# Patient Record
Sex: Female | Born: 1970 | Race: White | Hispanic: No | Marital: Single
Health system: Southern US, Community
[De-identification: ages and names within clinical notes are randomized; demographics above are authoritative.]

---

## 1998-11-21 ENCOUNTER — Emergency Department (HOSPITAL_COMMUNITY): Admission: EM | Admit: 1998-11-21 | Discharge: 1998-11-21 | Payer: Self-pay | Admitting: Emergency Medicine

## 2006-04-10 ENCOUNTER — Emergency Department: Payer: Self-pay | Admitting: Emergency Medicine

## 2009-07-23 ENCOUNTER — Emergency Department: Payer: Self-pay | Admitting: Unknown Physician Specialty

## 2009-07-23 ENCOUNTER — Emergency Department: Payer: Self-pay | Admitting: Internal Medicine

## 2011-07-31 LAB — COMPREHENSIVE METABOLIC PANEL
Albumin: 4.3 g/dL (ref 3.4–5.0)
Alkaline Phosphatase: 63 U/L (ref 50–136)
Anion Gap: 15 (ref 7–16)
BUN: 10 mg/dL (ref 7–18)
Calcium, Total: 9 mg/dL (ref 8.5–10.1)
Chloride: 104 mmol/L (ref 98–107)
Co2: 19 mmol/L — ABNORMAL LOW (ref 21–32)
EGFR (African American): >60
EGFR (Non-African Amer.): >60
Glucose: 184 mg/dL — ABNORMAL HIGH (ref 65–99)
Osmolality: 279 (ref 275–301)
Potassium: 3.2 mmol/L — ABNORMAL LOW (ref 3.5–5.1)
SGOT(AST): 25 U/L (ref 15–37)
Sodium: 138 mmol/L (ref 136–145)
Total Protein: 7.8 g/dL (ref 6.4–8.2)

## 2011-07-31 LAB — CBC
HCT: 43.1 % (ref 35.0–47.0)
MCHC: 33.9 g/dL (ref 32.0–36.0)
MCV: 95 fL (ref 80–100)
RBC: 4.52 10*6/uL (ref 3.80–5.20)
WBC: 15.6 10*3/uL — ABNORMAL HIGH (ref 3.6–11.0)

## 2011-07-31 LAB — SALICYLATE LEVEL: Salicylates, Serum: <1.7 mg/dL

## 2011-07-31 LAB — ETHANOL: Ethanol: <3 mg/dL

## 2011-07-31 LAB — TSH: Thyroid Stimulating Horm: 3.53 u[IU]/mL

## 2011-08-01 ENCOUNTER — Inpatient Hospital Stay: Payer: Self-pay | Admitting: Psychiatry

## 2011-08-01 LAB — DRUG SCREEN, URINE
Barbiturates, Ur Screen: NEGATIVE (ref ?–200)
Cannabinoid 50 Ng, Ur ~~LOC~~: POSITIVE (ref ?–50)
Cocaine Metabolite,Ur ~~LOC~~: NEGATIVE (ref ?–300)
Methadone, Ur Screen: NEGATIVE (ref ?–300)
Opiate, Ur Screen: NEGATIVE (ref ?–300)
Tricyclic, Ur Screen: NEGATIVE (ref ?–1000)

## 2011-08-01 LAB — URINALYSIS, COMPLETE
Bilirubin,UR: NEGATIVE
Glucose,UR: >=500 mg/dL (ref 0–75)
Nitrite: NEGATIVE
Ph: 5 (ref 4.5–8.0)
Protein: 30
RBC,UR: 16 /HPF (ref 0–5)
Specific Gravity: 1.023 (ref 1.003–1.030)
WBC UR: 5 /HPF (ref 0–5)

## 2011-08-02 LAB — CBC WITH DIFFERENTIAL/PLATELET
Eosinophil %: 0.6 %
HCT: 35.9 % (ref 35.0–47.0)
HGB: 12.1 g/dL (ref 12.0–16.0)
Lymphocyte #: 2 10*3/uL (ref 1.0–3.6)
Lymphocyte %: 24.5 %
MCH: 32.4 pg (ref 26.0–34.0)
MCV: 96 fL (ref 80–100)
Monocyte %: 7.5 %
Neutrophil #: 5.5 10*3/uL (ref 1.4–6.5)
RBC: 3.75 10*6/uL — ABNORMAL LOW (ref 3.80–5.20)
RDW: 13.3 % (ref 11.5–14.5)
WBC: 8.2 10*3/uL (ref 3.6–11.0)

## 2011-08-02 LAB — LIPID PANEL
Cholesterol: 191 mg/dL (ref 0–200)
HDL Cholesterol: 69 mg/dL — ABNORMAL HIGH (ref 40–60)
Ldl Cholesterol, Calc: 112 mg/dL — ABNORMAL HIGH (ref 0–100)
Triglycerides: 49 mg/dL (ref 0–200)
VLDL Cholesterol, Calc: 10 mg/dL (ref 5–40)

## 2011-08-02 LAB — FOLATE: Folic Acid: 14.8 ng/mL (ref 3.1–100.0)

## 2011-08-11 ENCOUNTER — Inpatient Hospital Stay: Payer: Self-pay | Admitting: Psychiatry

## 2011-08-11 LAB — COMPREHENSIVE METABOLIC PANEL
Anion Gap: 7 (ref 7–16)
BUN: 6 mg/dL — ABNORMAL LOW (ref 7–18)
Calcium, Total: 9 mg/dL (ref 8.5–10.1)
Co2: 29 mmol/L (ref 21–32)
EGFR (African American): >60
EGFR (Non-African Amer.): >60
Glucose: 116 mg/dL — ABNORMAL HIGH (ref 65–99)
Osmolality: 280 (ref 275–301)
Potassium: 3.8 mmol/L (ref 3.5–5.1)
SGOT(AST): 23 U/L (ref 15–37)
Sodium: 141 mmol/L (ref 136–145)
Total Protein: 7.4 g/dL (ref 6.4–8.2)

## 2011-08-11 LAB — ETHANOL: Ethanol %: <0.003 % (ref 0.000–0.080)

## 2011-08-11 LAB — DRUG SCREEN, URINE
Amphetamines, Ur Screen: NEGATIVE (ref ?–1000)
Barbiturates, Ur Screen: NEGATIVE (ref ?–200)
Benzodiazepine, Ur Scrn: NEGATIVE (ref ?–200)
Cannabinoid 50 Ng, Ur ~~LOC~~: NEGATIVE (ref ?–50)
MDMA (Ecstasy)Ur Screen: NEGATIVE (ref ?–500)
Methadone, Ur Screen: NEGATIVE (ref ?–300)
Phencyclidine (PCP) Ur S: NEGATIVE (ref ?–25)

## 2011-08-11 LAB — URINALYSIS, COMPLETE
Bacteria: NONE SEEN
Bilirubin,UR: NEGATIVE
Glucose,UR: NEGATIVE mg/dL (ref 0–75)
Ketone: NEGATIVE
Ph: 7 (ref 4.5–8.0)
RBC,UR: 2 /HPF (ref 0–5)
WBC UR: <1 /HPF (ref 0–5)

## 2011-08-11 LAB — TSH: Thyroid Stimulating Horm: 1.16 u[IU]/mL

## 2011-08-11 LAB — CBC
HCT: 38.9 % (ref 35.0–47.0)
HGB: 13.4 g/dL (ref 12.0–16.0)
MCHC: 34.5 g/dL (ref 32.0–36.0)
RBC: 4.09 10*6/uL (ref 3.80–5.20)
WBC: 10.9 10*3/uL (ref 3.6–11.0)

## 2011-08-18 LAB — LIPID PANEL
Cholesterol: 189 mg/dL (ref 0–200)
Ldl Cholesterol, Calc: 106 mg/dL — ABNORMAL HIGH (ref 0–100)
Triglycerides: 69 mg/dL (ref 0–200)
VLDL Cholesterol, Calc: 14 mg/dL (ref 5–40)

## 2013-10-12 IMAGING — CR DG CHEST 1V PORT
1 series · 1 of 1 positions shown · non-contrast
Comparison: none

REASON FOR EXAM: R/O Aspiration Pneumo
COMMENTS:

PROCEDURE:     DXR - DXR PORTABLE CHEST SINGLE VIEW  - August 01, 2011  [DATE]
RESULT:     The lung fields are clear. The heart, mediastinal and osseous
structures show no significant abnormalities.

[portable]
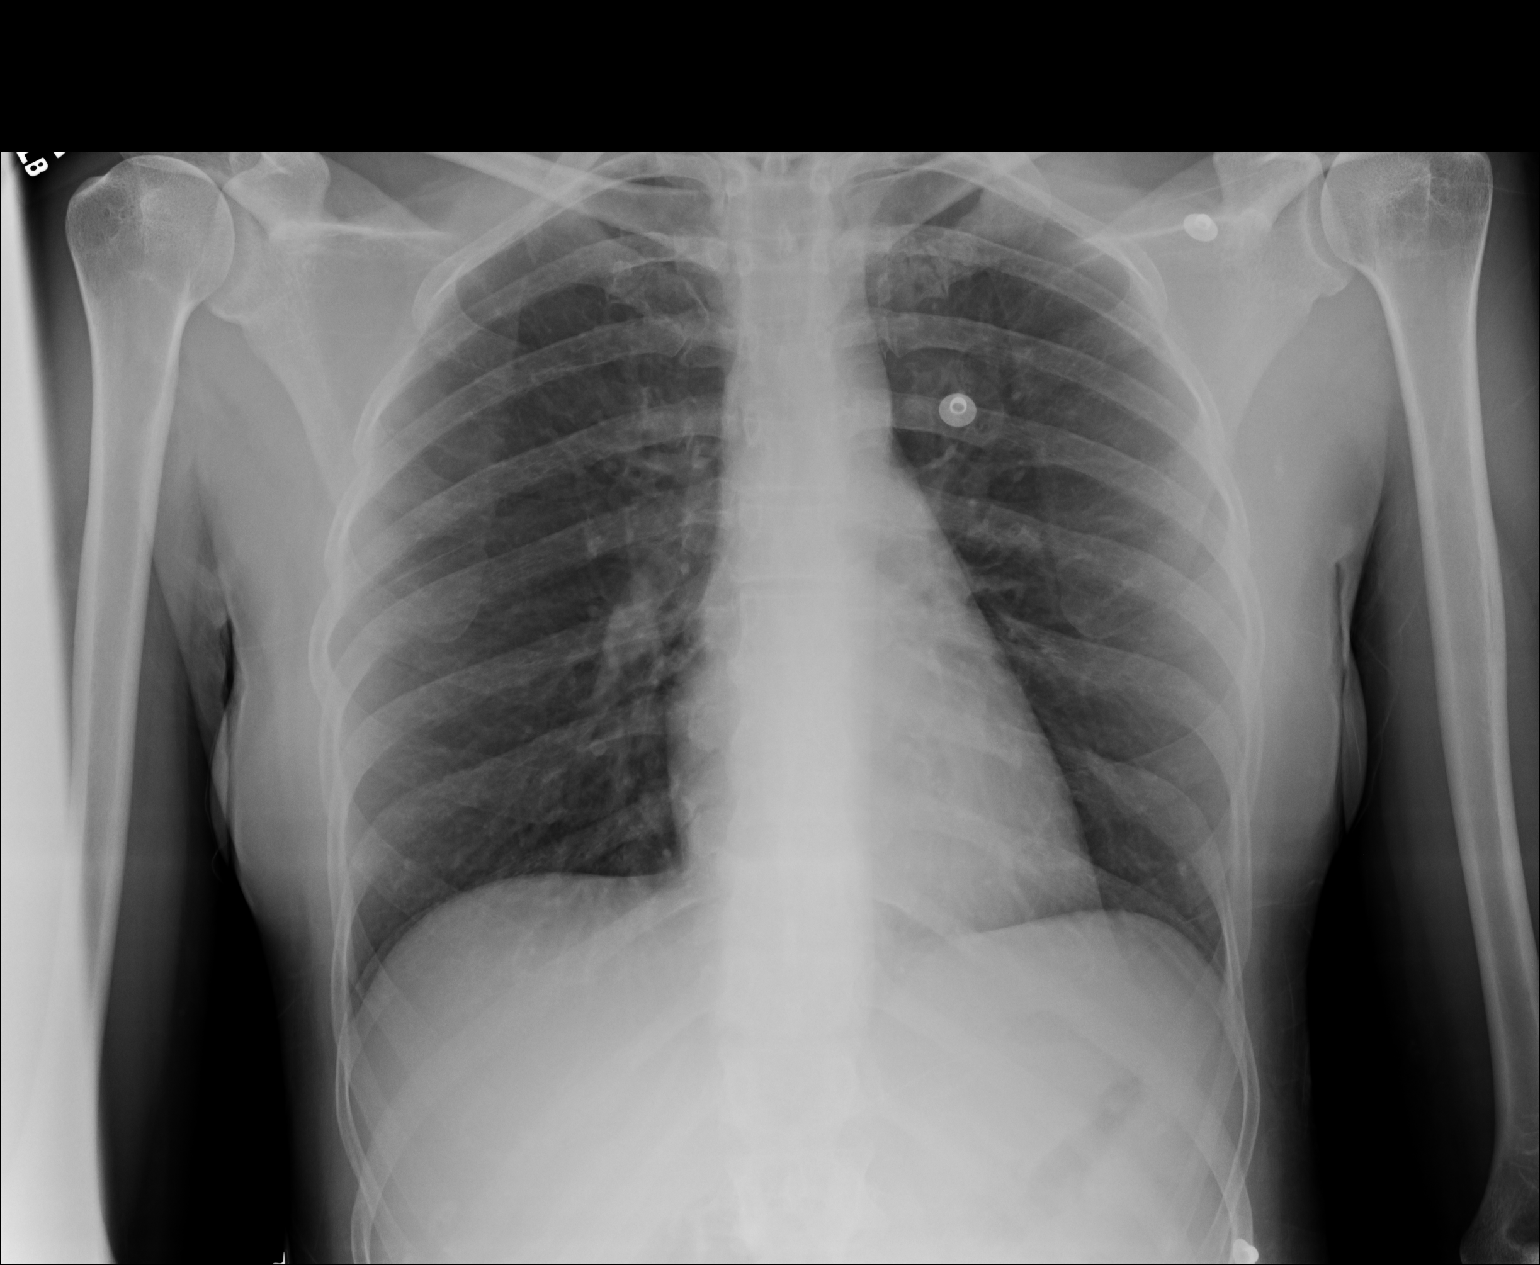

[1 of 1 positions shown; findings below may reference images not displayed]

IMPRESSION: 1.     No significant abnormalities are noted.

## 2014-11-13 NOTE — H&P (Signed)
PATIENT NAME:  Natasha Robinson, Natasha Robinson MR#:  [id number] DATE OF BIRTH:  08/28/1970  DATE OF ADMISSION:  08/11/2011  REFERRING PHYSICIAN: Dr. Enedina Finner  ADMITTING PHYSICIAN: Caryn Section, M.D.   REASON FOR ADMISSION: "I just needed to be back in a controlled environment."   IDENTIFYING INFORMATION: Natasha Robinson is a 44 year old engaged Caucasian female currently living with her boyfriend in the [place] area for the past two years. She is currently unemployed and has a history of recurrent depression, anxiety, and polysubstance dependence. The patient was last discharged from [facility] inpatient psychiatry on 08/07/2011.   HISTORY OF PRESENT ILLNESS: Natasha Robinson is a 44 year old engaged Caucasian female with a history of recurrent depression, polysubstance abuse, and anxiety who just recently overdosed on Lamictal during her prior inpatient psychiatric hospitalization and was discharged on January 16. She now returns back to the Emergency Room stating that she has been having suicidal thoughts ever since she got home and is wanting to be back in a "controlled environment".  The patient says that although she was feeling suicidal she did not have a plan on this particular occasion. She says that she was taking her medications including Celexa and Abilify but did not go to her follow-up appointment on 01/18. When the patient presented to the Emergency Room she reported that she believed she took a whole bottle of Celexa and then said that that was last Tuesday, although the patient was in the hospital at the time. Affect is quite bizarre in the Emergency Room. She is denying any current hallucinations but does report having had paranoid thoughts, stating that she felt like the computer and the Internet were able to see her because she had looked at [employer] sites. She denies any alcohol or illicit drug use since being discharged from the hospital. Toxicology screen was negative for any substances and ethanol  level was less than 3. The patient does admit to feeling depressed with frequent crying spells, anhedonia, decreased energy level, and problems with insomnia. She said she has not been sleeping well since she left the hospital.  No significant change in appetite.   PAST PSYCHIATRIC HISTORY: The patient has had one prior inpatient psychiatric hospitalization here at [facility] and was just discharged on January 16. She did overdose on Lamictal during that hospitalization. The patient is supposed to be set up to see a counselor at [facility] and had an appointment with Dr. Alver Fisher on January 18 but did not go.    SUBSTANCE ABUSE HISTORY: There is a history of heavy alcohol use several years ago but no recent alcohol use. The patient also has a history of cocaine, cannabis, methamphetamine, and prescription narcotic and opioid abuse. Toxicology screen was negative in the Emergency Room and the patient reports not having used any of these substances other than marijuana recently. She normally uses marijuana on an almost weekly basis.   FAMILY PSYCHIATRIC HISTORY: The patient denies any history of mental illness or substance use in the family.   PAST MEDICAL HISTORY: No major medical conditions. She denies any history of any prior TBI or seizures.   OUTPATIENT MEDICATIONS: 1. Celexa 40 mg p.o. daily. 2. Abilify 7 mg p.o. daily.  3. Trazodone 100 mg p.o. at bedtime.  4. Multivitamin 1 tablet daily.   ALLERGIES: No known drug allergies.   SOCIAL HISTORY: The patient is currently living with her boyfriend in the [place] area. She is unemployed. She has a ninth-grade education and never obtained her  GED. 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 She is originally from the [place], [place] area and her father still lives there. Her mother is deceased and passed away two years ago. She denies any history of any physical or sexual abuse.   LEGAL HISTORY: The patient has a history of  a DUI for driving under the influence of methamphetamines 5 to 6 years ago. She spent 30 days in jail. She denies any current pending charges.   MENTAL STATUS EXAM: Natasha Robinson is a 44 year old Caucasian female with short brown hair. She was fully alert and oriented to time, place, and situation. She was wearing burgundy scrub pants and burgundy scrub top. Speech was regular rate and rhythm, fluent and coherent although soft. Mood was depressed and affect was depressed. Thought processes were linear, logical, and goal-directed. She did endorse passive suicidal thoughts, no specific plan. She denied any current auditory or visual hallucinations. She did feel somewhat mildly paranoid that her computer was tracking her.  Cognition was grossly intact. Attention and concentration were fairly good. Recall was three out of three initially and three out of three after five minutes. Judgment and insight are fairly good. She could name the presidents backwards to [place]. She did not have any difficulty spelling world backwards. Abstraction was good.   SUICIDE RISK ASSESSMENT: At this time Natasha Robinson remains at a moderately elevated risk of harm to self and others. She has recently had a pretty serious suicide attempt. She denies having any access to guns at home.   REVIEW OF SYSTEMS: CONSTITUTIONAL: She denies any weakness, fatigue, or weight changes. She denies any fever, chills, or night sweats. HEENT: She denies any headaches or dizziness. She denies any change in vision, diplopia or blurred vision. She denies any hearing loss or vertigo. She denies any neck pain. RESPIRATORY: She denies any shortness of breath or cough. CARDIOVASCULAR: She denies any chest pain or orthopnea. She denies any syncopal episodes. GI: She denies any nausea, vomiting, or abdominal pain. She denies any change in bowel movements. GU: She denies incontinence or problems with frequency of urine.  LYMPHATIC: She denies any anemia or easy  bruising.  ENDOCRINE: She denies any heat or cold intolerance. MUSCULOSKELETAL: She denies any muscle or joint pain. NEUROLOGIC: She denies any tingling or weakness. PSYCHIATRIC: Please see history of present illness.   PHYSICAL EXAMINATION:  VITAL SIGNS: Blood pressure 147/93, heart rate 87, respirations 18, temperature 98.1. Please see initial physical exam as completed by Dr. Guss Bunde.  LABORATORY, DIAGNOSTIC, AND RADIOLOGICAL DATA: Sodium 141, potassium 3.8, chloride 105, CO2 29, BUN 6, creatinine 0.55, glucose 116. LFTs, TSH, and CBC within normal limits. Urinalysis: Nitrite and leukocyte esterase negative with less than 1 WBC, no bacteria. Toxicology screen negative for all substances. Ethanol level less than 3. On recent admission on 01/11, B12 was 349 and the patient was given B12 supplementation at that time.   DIAGNOSES:  AXIS I:  Major depressive disorder, recurrent, with psychotic features. Cannabis abuse. History of polysubstance dependence including alcohol, cocaine, and methamphetamine dependence as well as opioid dependence, all in full remission.   AXIS II: Rule out personality disorder.   AXIS III: No major medical conditions.   AXIS IV: Severe. Unemployed, history of legal problems, noncompliance with treatment, comorbid substance use.   AXIS V: GAF at present equals 25.   ASSESSMENT AND TREATMENT RECOMMENDATIONS: Natasha Robinson is a 44 year old engaged Caucasian female with a history of recurrent depression as well as paranoid  thoughts, just recently discharged from [facility] inpatient psychiatry service after having had a recent overdose on Lamictal this month who now presents back to the Emergency Room wanting help due to worsening depressive symptoms and suicidal thoughts. The patient was unable to contract for safety outside of the hospital. We will admit to inpatient psychiatry for medication management, safety, and stabilization.  1. Major depressive  disorder, recurrent, with psychosis: We will plan to discontinue Abilify and start Remeron 15 mg p.o. nightly to help with insomnia and depression. We will continue Celexa 40 mg p.o. daily, as the patient is not wanting to discontinue Celexa. We will discontinue trazodone now that the patient is on Remeron. We will also start Risperdal 2 mg p.o. nightly for psychosis. The patient was given B12 supplementation just last week during her inpatient psychiatric hospitalization and placed on the B12 supplements. Folic acid was within normal limits during hospitalization last week.  2. History of polysubstance dependence: The patient had a negative toxicology screen in the Emergency Room. She was advised to abstain from alcohol and all illicit drugs as they may worsen mood symptoms.  3. Disposition: The patient has a stable living situation. Mental health followup will be with [facility] as the patient has no health care insurance. Individual therapy followup will be with [facility].   ____________________________ Natasha AlbinoAarti K. Maryruth BunKapur, MD akk:bjt D: 08/12/2011 08:55:12 ET T: 08/12/2011 10:41:18 ET JOB#: [id number]  cc: Natasha Robinson K. Maryruth BunKapur, MD, <Dictator> Darliss RidgelAARTI K Shloimy Michalski MD ELECTRONICALLY SIGNED 08/13/2011 12:34

## 2014-11-13 NOTE — H&P (Signed)
PATIENT NAME:  Natasha Robinson, Natasha Robinson MR#:  [id number] DATE OF BIRTH:  1970-10-13  DATE OF ADMISSION:  08/11/2011  INITIAL ASSESSMENT AND PSYCHIATRIC EVALUATION  IDENTIFYING INFORMATION: The patient is a 44 year old white female who is currently living with her fiance in the [place] area. The patient was recently discharged from [facility] on 08/07/2011 when she was stabilized for her depression. She was admitted status post overdose on Lamictal and intentional suicide attempt. The patient was discharged with followup appointment at [facility]. The patient comes back for readmission with a chief complaint, "I felt suicidal. I took an overdose of my fiance's pills the doctor prescribed for him and I told him and he knew that. I am being overwhelmed taking care of laundry, cooking, cleaning and everything by myself and my fiance has his own issues and we meet in the middle sometimes to help each other".   HISTORY OF PRESENT ILLNESS:  When the patient was asked when she last felt well, she reported that she was stabilized at [facility] and was discharged, but started feeling overwhelmed when she went home because she has to look for a job and she has to catch up with all the household chores and she is not able to do so.   PAST PSYCHIATRIC HISTORY: Had prior inpatient hospitalization in psychiatry and the recent one was in January of 2013. No history of any suicide attempts, but did have suicidal thoughts. However, she overdosed on pills on a couple of occasions. She has an appointment scheduled at [facility].  DISCHARGE MEDICATIONS:  1. Multivitamin one capsule orally every day.  2. Celexa 40 milligrams every day. 3. Trazodone 100 milligrams at bedtime.  4. Abilify 7 milligrams one tablet every day.    According to information obtained from the chart, the patient has a followup appointment at [facility] with Dr. Karin GoldenMyckiewicz and  date of appointment was 08/09/2011 at 12 p.m.  In addition, she has followup appointment at [facility] on 08/13/2011.   FAMILY HISTORY OF MENTAL ILLNESS: None known for mental illness.  No known history of suicides in the family.   FAMILY HISTORY:  Raised by her parents. She was born and raised in [place] by both of her parents. Father and sister live in [place]. She has been married, but currently separated from her husband for ten years and does not have any children.   PERSONAL HISTORY: Born in [place]. She has a 9th grade education. Never got a G.E.D.   WORK HISTORY: Not employed for quite some time and is on disability. 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 Denies any history of physical or sexual abuse.   LEGAL HISTORY: Has history of DWI for driving under the influence of methamphetamines many years ago and served a 30-day jail sentence. No pending legal charges.   MARRIAGES: Married once and currently separated for ten years and lives with her fiance. No children.   ALCOHOL AND DRUGS: History of heavy alcohol drinking for several years, but been sober for seven days and not had any drink of alcohol for seven days. History of cocaine abuse, THC abuse, methamphetamine abuse and prescription and narcotic and opioid drug abuse. She has been sober from all these drugs and not abused them recently. She smokes cigarettes at the rate of a pack a day for several years.   MEDICAL HISTORY: No known history of high blood pressure. No known history of diabetes mellitus. No major  surgeries. No major injuries. No history of motor vehicle accident. Never been unconscious. No known drug allergies. Being followed on outpatient basis as stated in the discharge summary.    PHYSICAL EXAMINATION:   VITAL SIGNS: Temperature 98.1, pulse 82 per minute, regular, respirations 18 per minute, regular, blood pressure 140/90.  HEENT: Head is normocephalic, atraumatic. Eyes: Pupils are equal, round and reactive to  light and accommodation. Fundi bilaterally benign. Extraocular movements visualized. Tympanic membranes visualized. No exudates.   NECK: Supple without any organomegaly, lymphadenopathy or thyromegaly.    CHEST: Normal expansion. Normal breath sounds heard.   HEART: Normal S1, S2 without any murmurs or gallops.   ABDOMEN: Soft. No organomegaly. Bowel sounds heard.   RECTAL/PELVIC: Deferred.   EXTREMITIES: Pedal pulses 2+ and no rashes, cyanosis, clubbing or edema.   NEUROLOGIC: Gait is normal. Romberg is negative. Cranial nerves II-XII are grossly intact. Deep tendon reflexes 2+. Plantars are normal.   MENTAL STATUS EXAMINATION: The patient is dressed in street clothes. Hair is not combed and no make-up and grooming is below average. Alert and oriented to place, person and time. Fully aware of the situation that brought her for admission to [facility]. Pleasant and cooperative, good historian. Affect is flat. Mood disturbed. Does admit feeling overwhelmed with work and not able to cope and gets depressed and sad and has suicidal thoughts. No evidence of psychosis. Denies auditory or visual hallucinations, hearing voices or seeing things. Denies paranoid or suspicious ideas. Does admit to sleep and appetite disturbance because of being overwhelmed and not able to cope. She could spell the word world forward, but could not spell it backward. She said that she could not count money because she could not focus and probably still feeling low and down. Denies any suicidal plans right now and contracts for safety. Insight and judgment poor-guarded.   IMPRESSION: AXIS I:  1. Major depressive disorder, recurrent, with suicidal ideas.  2. Nicotine abuse. 3. History of polysubstance dependence including alcohol, cocaine, methamphetamines, cannabis and opioid which is in remission.  AXIS II: None.   AXIS III: Recent overdose of Celexa which is prescribed for her fiance.  AXIS  IV: Severe. Unemployed, occupational, financial, noncompliance with treatment. Gets overwhelmed with poor coping skills.   AXIS V: Global Assessment of Functioning 25.   PLAN: The patient admitted to [facility] for close observation, evaluation and help. She will started back on her medications after she is medically stable. During the stay in the hospital she will be given milieu therapy and supportive counseling. She will take part in individual and group therapy where coping skills will be discussed. Social services will look into appropriate discharge planning so that she may get some help when she is discharged and will have appropriate follow-up appointment.   ____________________________ Jannet Mantis. Guss Bunde, MD skc:ap D: 08/11/2011 19:06:31 ET T: 08/12/2011 09:41:47 ET JOB#: [id number]  cc: Monika Salk K. Guss Bunde, MD, <Dictator> Beau Fanny MD ELECTRONICALLY SIGNED 08/18/2011 12:31

## 2014-11-13 NOTE — Consult Note (Signed)
Psychological Assessment  Natasha Cooper40of Evaluation: 1-21-13Administered: [facility] Personality Inventory-2 (MMPI-2) for Referral: Natasha Robinson was referred for a psychological assessment by her physician, Caryn Section.  She was admitted to Behavioral Medicine for treatment of increasing depression with psychotic symptoms. Please see the history and physical and psychosocial history for further background information. An assessment of personality structure was requested. Natasha Robinson?s MMPI-2 protocol is compared to that of other adult females she obtained the following profile: 6*84"2?90+73-1/5:#. The MMPI-2 validity scales indicate that the clinical profile is probably valid. They also suggest she is experiencing significant distress. PresentationShe reports that she is experiencing mild to moderate emotional distress that is characterized by brooding, dysphoric mood, and anhedonia. She has had long periods when she could not get things done because she could not get going. She broods and worries constantly over what is happening to her. Her feelings are easily hurt and she is inclined to take things hard. She is generally stubborn, argumentative, and angry. She is usually able to control the expression of her anger, but she does exhibit episodic angry outbursts, particularly in response to stress. It makes her angry when people hurry her. She also gets angry with herself for giving in to others so much. She has become so angry that she does not know what comes over her and she feels as though she will explode. At times she feels like smashing things. She reports problems her memory. But, she remembers very well, and for a long time, anything that people do to her. She has strong opinions that she expresses directly to other people. She likes to let people know where she stands on things. She has had very peculiar and strange experiences. At times her thoughts have raced ahead faster than she could  speak them. She has sometimes thought that difficulties were piling up so high she could not overcome them. She certainly is lacking in self-confidence and believes that she is not as good as other people. She has often lost out on things because she could not make up her mind quickly enough. She is very sensitive to and resentful of any demands being placed on her. She often wonders what hidden reason another person may have for doing something nice for her. She is sure that she is being talked about and that strangers were looking at her critically. She believes that people say insulting and vulgar things about her. She is sure that she is often being punished without cause. She knows who is responsible for most of her troubles. She believes that she has not lived the right kind of life and wishes that she could be as happy as others seem to be. She has often been misunderstood when she was trying to be helpful and her way of doing things is apt to be misunderstood by others. Relations:  She reports that she is introverted. She enjoys social gatherings and parties. Even when she is with people, she feels lonely much of the time. She is likely to have a long history of very poor interpersonal relations characterized often by resentment, anger, and suspiciousness. She sees her family as extremely uncaring and critical and her home life as very unpleasant. She is alienated and detached from herself and others. She has very little to do with her relatives now.  Problem Areas: She reports only a few general concerns about her physical health. She usually has enough energy to do her work. She is likely to abuse alcohol or drugs so  a careful review should be made of the consequences of her alcohol and drug use. She reported that she has been in trouble with the law. She has done dangerous things for the thrill of it. Suicidal ideation is possible and needs to be evaluated carefully.  She is apathetic and hopeless,  broods over her problems and is under-controlled, which increases the probability of her acting out either towards herself or others if they provoke her.  Her prognosis is generally quite poor due to her lack of awareness of her role in her problems, which are very chronic and characterologic. She will be very demanding and will engage in frequent testing of the therapist. Short-term, behavioral interventions that are presented in a direct and explicit manner will be most effective. Impression:Disorder NOS with substantial paranoiaPersonality Disorder   Electronic Signatures: Carola FrostRoush, Lee Ann (PsyD, HSP-P)  (Signed on 23-Jan-13 16:39)  Authored  Last Updated: 23-Jan-13 16:39 by Carola Frostoush, Lee Ann (PsyD, HSP-P)

## 2014-11-13 NOTE — H&P (Signed)
PATIENT NAME:  Natasha Robinson Robinson, Natasha Robinson MR#:  [id number] DATE OF BIRTH:  1971-01-14  DATE OF ADMISSION:  08/01/2011  REFERRING PHYSICIAN:   Maurilio LovelyNoelle McLaurin, MD   ADMITTING PHYSICIAN: Caryn SectionAarti Luan Maberry, MD   REASON FOR ADMISSION: Status post overdose on Lamictal in intentional suicide attempt.  IDENTIFYING INFORMATION: Natasha Robinson Robinson is a 44 year old single Caucasian female currently living with her boyfriend in the [place] area for the past two years. She is unemployed and has a history of recurrent depression, anxiety, and polysubstance dependence.   HISTORY OF PRESENT ILLNESS: Natasha Robinson Robinson is a 44 year old single Caucasian female with a 10-year history of polysubstance dependence as well as recurrent depression and anxiety, per her boyfriend, who was brought by her boyfriend to the Emergency Room after having overdosed on Lamictal, approximately 30 pills of an unknown quantity. The Lamictal apparently belonged to a boyfriend.  The patient herself was an extremely poor historian, completely disoriented, and repeatedly stating, "Dr. Manson PasseyBrown smiley face, Dr. Manson PasseyBrown smiley face, Dr. Manson PasseyBrown smiley face." The patient refused to answer questions appropriately. She was anxious, and affect was quite bizarre at times. She was screaming hysterically and affect was labile. Most of the history is taken per the patient's boyfriend as he was the only contact person. Her father could not be reached. The patient apparently overdosed on the pills, per her boyfriend, in an intentional suicide attempt. The patient's boyfriend said she has been depressed for the past 4 to 5 years since being arrested for methamphetamine use and a DUI approximately 5 to 6 years ago. She apparently served a 30-day jail sentence for this DUI about 5 years ago. Her boyfriend says that yesterday she was paranoid, believing that she was in trouble with the law. The boyfriend is unaware of any arrest warrants out for her and says that she has not been in trouble with the  law recently. Her boyfriend suspected that she started to reuse multiple drugs about 6 months ago, including prescription narcotics, benzodiazepines, and methamphetamines. Toxicology screen was positive for cannabis but negative for all other substances. Ethanol level was less than 3.0. Per her boyfriend, she was "talking in riddles" all day yesterday. Per her boyfriend, there has been no history of any prior inpatient psychiatric hospitalizations or suicide attempts. She did see a counselor at [facility] last week but has not seen a psychiatrist regularly. She was prescribed Celexa by a clinic in [place] and has been on the medication for several months.   PAST PSYCHIATRIC HISTORY: The patient has not had any prior inpatient psychiatric hospitalizations, per her boyfriend. There has been no history of any suicide attempt. She was seen by a counselor at [facility] last week and has been on Celexa for several months, unknown dosage.   SUBSTANCE ABUSE HISTORY: Per her boyfriend, there is a history of heavy alcohol use several years ago but no recent alcohol use. There has also been a history of cocaine, cannabis, methamphetamine, prescription narcotic and opioid abuse. The boyfriend is unclear as to what substances she has been using recently. The patient herself was unable to provide any information. She did report that she had been using methamphetamines, and toxicology screen was positive for cannabis but negative for amphetamines.   FAMILY PSYCHIATRIC HISTORY: The patient was unable to give any reliable information.   PAST MEDICAL HISTORY: The patient was unable to give any reliable information. Per her boyfriend, she has no major medical conditions. It is unclear whether or not she has had a history of seizures.  The patient's boyfriend did not know.   OUTPATIENT MEDICATIONS: Celexa, unknown dosage.   ALLERGIES: No known drug allergies.   SOCIAL HISTORY: The patient says she was born and raised in [place] by both her parents, not clear whether or not this is actually true. Her dad and sister live in [place], per her boyfriend, and the patient used to live in [place] but has been with her boyfriend in [place] for the past two years. She is currently separated from her husband for the past 10 years and does not have any children. She has a 9th-grade education but never obtained her GED. The patient is currently unemployed and is not on Disability. 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 She has a Banker education and never obtained her GED. She denies any history of any physical or sexual abuse, although this may be unreliable. We will need to reassess.   LEGAL HISTORY: The patient has a history of a DUI for driving under the influence of methamphetamines 5 to 6 years ago and served a 30-day jail sentence. It is unclear at this time whether or not she has any pending charges.   MENTAL STATUS EXAM: Natasha Robinson Robinson is a 44 year old Caucasian female who was lying in her hospital stretcher. She was quite anxious and restless. The patient was completely disoriented to time, place, and situation and would only repeatedly state "Glee Arvin, Dr. Manson Passey smiley face, Dr. Manson Passey smiley face, Dr. Manson Passey smiley face." Speech at times was loud, and the patient was just yelling out hysterically. Affect was quite labile. She did admit that she took the Lamictal in an intentional suicide attempt, although she would give any other information. Judgment and insight appeared to be poor. Attention and concentration are poor. Judgment and insight are poor.   SUICIDE RISK ASSESSMENT: The patient is at high risk of harm to self and others secondary to disorientation and intentional suicide attempt. It is unclear at this time whether or not she has any guns in the household.   REVIEW OF SYSTEMS: Unable to assess as the patient is currently agitated and disoriented.   PHYSICAL EXAMINATION:  VITAL SIGNS: Blood pressure  136/82, heart rate 88, respirations 20, temperature 100.2, pulse oximetry 97% on room air.   HEENT: Normocephalic, atraumatic. Pupils are equal, round and reactive to light and accommodation. Extraocular movements are intact. Oral mucosa was moist. No lesions noted.   NECK: Supple. No cervical lymphadenopathy or thyromegaly present.   LUNGS: Clear to auscultation bilaterally. No crackles, rales, or rhonchi.   CARDIAC: S1, S2 present, tachycardic. Regular rhythm. No murmurs, rubs, or gallops.   ABDOMEN: Soft. Normoactive bowel sounds present in all four quadrants. No tenderness noted. No masses noted. No distention.   EXTREMITIES: Pedal pulses +2 bilaterally. No rashes, cyanosis, clubbing, or edema.   NEUROLOGIC: Cranial nerves II through XII are grossly intact. The patient was able to walk without difficulty.   LABORATORY, DIAGNOSTIC AND RADIOLOGICAL DATA:  Pregnancy test negative.  Toxicology screen positive for cannabis, negative for all other substances.  Ethanol level less than 3.0.  Sodium 138, potassium 3.2, chloride 104, CO2 19, BUN 10, creatinine 0.81, glucose 194. LFTs within normal limits. TSH within normal limits.  White blood cell count 15.6, hemoglobin 14.6, platelet count 253.  Urinalysis: Glucose greater than 500, nitrite negative, leukocyte esterase negative, 5 WBC, trace bacteria, 21 epithelial cells.  Acetaminophen and salicylate levels were unremarkable.   DIAGNOSES:  AXIS I:  1. Major depressive disorder,  recurrent.  2. Delirium, most likely secondary to overdose and possible polysubstance dependence. 3. History of polysubstance dependence, including alcohol, cocaine, cannabis, and methamphetamine as well as opioid abuse.   AXIS II: Deferred.   AXIS III: No major medical conditions reported at this time.   AXIS IV: Severe-unemployed, history of legal problems, noncompliance with treatment, comorbid substance use.   AXIS V: Global Assessment of Functioning  score at present equals 25.   ASSESSMENT AND TREATMENT RECOMMENDATIONS: Ms. Oliveto is a 44 year old currently separated Caucasian female living with her boyfriend here in the [place] area who was brought to the hospital after having overdosed on Lamictal in an intentional suicide attempt. She is currently disoriented, confused and agitated. The patient will be admitted to Inpatient Psychiatry for medication management, safety, and stabilization.   1. Major depressive disorder, recurrent: The patient has overdosed on Lamictal in an intentional suicide attempt. She is currently disoriented and confused. We will hold all psychotropic medications for now until we can get a more reliable history. We will recheck EKG in the a.m. to rule out any QTc prolongation. The patient has been cleared by Poison Control.  Last EKG showed a QTc interval of 478. We will use Ativan and Haldol p.r.n. for agitation and psychosis, for now. We will check B12 and folate level in the a.m. as well as lipid panel.  2. History of polysubstance dependence: We will need to get a more reliable history from the patient. It does appear as if she has been using methamphetamine recently, in addition to marijuana. It is unclear what other drugs she may have been using. We will most likely refer for residential substance abuse treatment at [facility].  3. Low-grade temperature of 100.2: We will give Tylenol p.r.n.     4. Disposition: We will most likely transfer to residential substance abuse treatment at [facility].  ____________________________ Doralee Albino. Maryruth Bun, MD akk:[id number] D: 08/01/2011 08:12:08 ET T: 08/01/2011 08:37:57 ET JOB#: [id number]  cc: Zylon Creamer K. Maryruth Bun, MD, <Dictator> Darliss Ridgel MD ELECTRONICALLY SIGNED 08/02/2011 19:36
